# Patient Record
Sex: Male | Born: 1980 | Race: White | Hispanic: No | Marital: Single | Smoking: Never smoker
Health system: Southern US, Community
[De-identification: ages and names within clinical notes are randomized; demographics above are authoritative.]

## PROBLEM LIST (undated history)

## (undated) ENCOUNTER — Emergency Department (HOSPITAL_COMMUNITY): Admission: EM | Payer: Self-pay | Source: Home / Self Care

---

## 2007-02-28 ENCOUNTER — Emergency Department (HOSPITAL_COMMUNITY): Admission: EM | Admit: 2007-02-28 | Discharge: 2007-02-28 | Payer: Self-pay | Admitting: Emergency Medicine

## 2007-03-05 ENCOUNTER — Ambulatory Visit: Payer: Self-pay | Admitting: Orthopedic Surgery

## 2007-03-05 DIAGNOSIS — M25529 Pain in unspecified elbow: Secondary | ICD-10-CM | POA: Insufficient documentation

## 2010-03-25 ENCOUNTER — Emergency Department (HOSPITAL_COMMUNITY): Admission: EM | Admit: 2010-03-25 | Discharge: 2010-03-25 | Payer: Self-pay | Admitting: Emergency Medicine

## 2012-07-03 ENCOUNTER — Emergency Department (HOSPITAL_COMMUNITY)
Admission: EM | Admit: 2012-07-03 | Discharge: 2012-07-04 | Disposition: A | Discharge status: Home / Self Care | Payer: Self-pay | Attending: Emergency Medicine | Admitting: Emergency Medicine

## 2012-07-03 ENCOUNTER — Encounter (HOSPITAL_COMMUNITY): Payer: Self-pay | Admitting: *Deleted

## 2012-07-03 DIAGNOSIS — N5089 Other specified disorders of the male genital organs: Secondary | ICD-10-CM | POA: Insufficient documentation

## 2012-07-03 DIAGNOSIS — N453 Epididymo-orchitis: Secondary | ICD-10-CM | POA: Insufficient documentation

## 2012-07-03 DIAGNOSIS — R109 Unspecified abdominal pain: Secondary | ICD-10-CM | POA: Insufficient documentation

## 2012-07-03 LAB — URINALYSIS, ROUTINE W REFLEX MICROSCOPIC
Bilirubin Urine: NEGATIVE
Hgb urine dipstick: NEGATIVE
Ketones, ur: NEGATIVE mg/dL
Leukocytes, UA: NEGATIVE
Protein, ur: NEGATIVE mg/dL
Specific Gravity, Urine: >1.03 — ABNORMAL HIGH (ref 1.005–1.030)

## 2012-07-03 MED ORDER — IBUPROFEN 800 MG PO TABS
800.0000 mg | ORAL_TABLET | Freq: Once | ORAL | Status: AC
  Administered 2012-07-03: 800 mg via ORAL
  Filled 2012-07-03: qty 1

## 2012-07-03 MED ORDER — HYDROCODONE-ACETAMINOPHEN 5-325 MG PO TABS
2.0000 | ORAL_TABLET | Freq: Once | ORAL | Status: AC
  Administered 2012-07-03: 2 via ORAL
  Filled 2012-07-03: qty 2

## 2012-07-03 NOTE — ED Notes (Signed)
Lt testicular swelling and  Pain, No injury.

## 2012-07-03 NOTE — ED Provider Notes (Signed)
History    This chart was scribed for Edward Skene, MD by Gerlean Ren, ED Scribe. This patient was seen in room [facility]11/[facility]11 and the patient's care was started at 11:05 PM    CSN: [id number]  Arrival date & time 07/03/12  2202   First MD Initiated Contact with Patient 07/03/12 2259      Chief Complaint  Patient presents with  . Testicle Pain     The history is provided by the patient. No language interpreter was used.  Edward Farrell is a 32 y.o. male who presents to the Emergency Department complaining of 2 days of gradual onset, constant left testicular pain that has been gradually worsening in severity with associated swelling beginning last night and gradually worsening since onset. Pain is 9/10 when contact is made with it, but significantly more mild and dull without touch.  Pt denies penile discharge, penile pain, dysuria, urgency, frequency, fever, chills, nausea, emesis, blood in stools, tarry stools, diarrhea.  Pt reports some lower abdominal pain.  No associated traumas to testicles.  Nothing used for pain.  No known allergies.  No family h/o cancer.  Pt reports social alcohol use and rare marijuana use.  History reviewed. No pertinent past medical history.  History reviewed. No pertinent past surgical history.  History reviewed. No pertinent family history.  History  Substance Use Topics  . Smoking status: Never Smoker   . Smokeless tobacco: Not on file  . Alcohol Use: Yes     Comment: rarely      Review of Systems  Constitutional: Negative for fever.  Eyes: Negative for visual disturbance.  Respiratory: Negative for shortness of breath.   Cardiovascular: Negative for chest pain.  Gastrointestinal: Positive for abdominal pain. Negative for nausea, vomiting and diarrhea.  Endocrine: Negative for polyuria.  Genitourinary: Positive for testicular pain. Negative for dysuria, urgency, frequency, hematuria, discharge and penile pain.  Musculoskeletal: Negative for  myalgias.  Skin: Negative for rash.  Neurological: Negative for headaches.  All other systems reviewed and are negative.    Allergies  Review of patient's allergies indicates not on file.  Home Medications  No current outpatient prescriptions on file.  BP 120/74  Pulse 76  Temp(Src) 98.6 F (37 C) (Oral)  Resp 18  Ht 6\' 2"  (1.88 m)  Wt 180 lb (81.647 kg)  BMI 23.1 kg/m2  SpO2 98%  Physical Exam  Nursing notes reviewed.  Electronic medical record reviewed. VITAL SIGNS:   Filed Vitals:   07/03/12 2207 07/04/12 0056  BP: 120/74 123/64  Pulse: 76 65  Temp: 98.6 F (37 C)   TempSrc: Oral   Resp: 18 20  Height: 6\' 2"  (1.88 m)   Weight: 180 lb (81.647 kg)   SpO2: 98% 97%   CONSTITUTIONAL: Awake, oriented, appears non-toxic HENT: Atraumatic, normocephalic, oral mucosa pink and moist, airway patent. Nares patent without drainage. External ears normal. EYES: Conjunctiva clear, EOMI, PERRLA NECK: Trachea midline, non-tender, supple CARDIOVASCULAR: Normal heart rate, Normal rhythm, No murmurs, rubs, gallops PULMONARY/CHEST: Clear to auscultation, no rhonchi, wheezes, or rales. Symmetrical breath sounds. Non-tender. ABDOMINAL: Non-distended, soft, non-tender - no rebound or guarding.  BS normal. NEUROLOGIC: Non-focal, moving all four extremities, no gross sensory or motor deficits. GU: Normal circumcised male. No hernias appreciated. No ulcerations or rash. Left testicle is tender to palpation especially around the epididymis which is firm and swollen. No lymphadenopathy. EXTREMITIES: No clubbing, cyanosis, or edema SKIN: Warm, Dry, No erythema, No rash  ED Course  Procedures (including  critical care time) DIAGNOSTIC STUDIES: Oxygen Saturation is 98% on room air, normal by my interpretation.    COORDINATION OF CARE: 11:14 PM- Patient informed of clinical course including urinalysis, understands medical decision-making process, and agrees with plan.  Results for orders  placed during the hospital encounter of 07/03/12  URINALYSIS, ROUTINE W REFLEX MICROSCOPIC      Result Value Range   Color, Urine YELLOW  YELLOW   APPearance HAZY (*) CLEAR   Specific Gravity, Urine >1.030 (*) 1.005 - 1.030   pH 6.0  5.0 - 8.0   Glucose, UA NEGATIVE  NEGATIVE mg/dL   Hgb urine dipstick NEGATIVE  NEGATIVE   Bilirubin Urine NEGATIVE  NEGATIVE   Ketones, ur NEGATIVE  NEGATIVE mg/dL   Protein, ur NEGATIVE  NEGATIVE mg/dL   Urobilinogen, UA 0.2  0.0 - 1.0 mg/dL   Nitrite NEGATIVE  NEGATIVE   Leukocytes, UA NEGATIVE  NEGATIVE    US Scrotum  07/04/2012  *RADIOLOGY REPORT*  Clinical Data:  Left testicular pain.  Pain for 3 days.  SCROTAL ULTRASOUND DOPPLER ULTRASOUND OF THE TESTICLES  Technique: Complete ultrasound examination of the testicles, epididymis, and other scrotal structures was performed.  Color and spectral Doppler ultrasound were also utilized to evaluate blood flow to the testicles.  Comparison:  None.  Findings:  Right testis:  The right testis measures 4.2 x 2.5 x 2.2 cm.  No focal testicular mass lesions.  Homogeneous parenchymal appearance. Normal flow demonstrated in the testis on color flow Doppler imaging.  Left testis:  The left testis measures 3.6 x 2.3 x 2.6 cm.  No focal testicular mass lesions.  Homogeneous parenchymal appearance. Normal flow demonstrated in the testis on color flow Doppler imaging.  Right epididymis:  Normal in size and appearance.  Left epididymis:  Increased flow is demonstrated to the left epididymis suggesting epididymitis.  No focal abscess.  Hydrocele:  Small bilateral hydroceles.  Varicocele:  No varicoceles present  Pulsed Doppler interrogation of both testes demonstrates low resistance flow bilaterally.  IMPRESSION: Normal appearance of the testicles.  Increased flow to the left epididymis suggest epididymitis.  Small bilateral hydroceles.   Original Report Authenticated By: Burman Nieves, M.D.    [place] Art/ven Flow Abd Pelv  Doppler  07/04/2012  *RADIOLOGY REPORT*  Clinical Data:  Left testicular pain.  Pain for 3 days.  SCROTAL ULTRASOUND DOPPLER ULTRASOUND OF THE TESTICLES  Technique: Complete ultrasound examination of the testicles, epididymis, and other scrotal structures was performed.  Color and spectral Doppler ultrasound were also utilized to evaluate blood flow to the testicles.  Comparison:  None.  Findings:  Right testis:  The right testis measures 4.2 x 2.5 x 2.2 cm.  No focal testicular mass lesions.  Homogeneous parenchymal appearance. Normal flow demonstrated in the testis on color flow Doppler imaging.  Left testis:  The left testis measures 3.6 x 2.3 x 2.6 cm.  No focal testicular mass lesions.  Homogeneous parenchymal appearance. Normal flow demonstrated in the testis on color flow Doppler imaging.  Right epididymis:  Normal in size and appearance.  Left epididymis:  Increased flow is demonstrated to the left epididymis suggesting epididymitis.  No focal abscess.  Hydrocele:  Small bilateral hydroceles.  Varicocele:  No varicoceles present  Pulsed Doppler interrogation of both testes demonstrates low resistance flow bilaterally.  IMPRESSION: Normal appearance of the testicles.  Increased flow to the left epididymis suggest epididymitis.  Small bilateral hydroceles.   Original Report Authenticated By: Burman Nieves, M.D.  1. Epididymitis, left       MDM  AQIB LOUGH is a 32 y.o. male presenting with left testicular pain. He denies any antecedent trauma, no history of torsion, ultrasound shows enlargement of left epididymis and bilateral small hydroceles. No evidence of torsion. This is consistent with physical exam suggesting epididymitis-discussed possible STI with the patient in that he may need to have a conversation with his girlfriend concerning this-he denies any sexual relationships outside of his monogamous relationship with his girlfriend - patient is had no other symptoms of STI such as  penile discharge or pain prior to today.  We'll discharge the patient home stable and in good condition with pain control as well as doxycycline. Discussed reasons to return to the emergency department including worsening pain sudden onset of severe pain, high fevers, vomiting or any other concerning symptoms. Have answered all the patient's questions.  I personally performed the services described in this documentation, which was scribed in my presence. The recorded information has been reviewed and is accurate. Edward Farrell, M.D.          Edward Skene, MD 07/04/12 [phone]

## 2012-07-04 ENCOUNTER — Emergency Department (HOSPITAL_COMMUNITY): Payer: Self-pay

## 2012-07-04 ENCOUNTER — Other Ambulatory Visit (HOSPITAL_COMMUNITY): Payer: Self-pay

## 2012-07-04 MED ORDER — HYDROCODONE-ACETAMINOPHEN 5-325 MG PO TABS: 1.0000 | ORAL_TABLET | Freq: Four times a day (QID) | ORAL | Status: DC | PRN

## 2012-07-04 MED ORDER — IBUPROFEN 800 MG PO TABS: 800.0000 mg | ORAL_TABLET | Freq: Three times a day (TID) | ORAL | Status: DC

## 2012-07-04 MED ORDER — DOXYCYCLINE HYCLATE 100 MG PO CAPS: 100.0000 mg | ORAL_CAPSULE | Freq: Two times a day (BID) | ORAL | Status: DC

## 2014-10-24 ENCOUNTER — Encounter (HOSPITAL_COMMUNITY): Payer: Self-pay | Admitting: Emergency Medicine

## 2014-10-24 ENCOUNTER — Emergency Department (HOSPITAL_COMMUNITY): Payer: Self-pay

## 2014-10-24 ENCOUNTER — Emergency Department (HOSPITAL_COMMUNITY)
Admission: EM | Admit: 2014-10-24 | Discharge: 2014-10-24 | Disposition: A | Discharge status: Home / Self Care | Payer: Self-pay | Attending: Emergency Medicine | Admitting: Emergency Medicine

## 2014-10-24 DIAGNOSIS — R112 Nausea with vomiting, unspecified: Secondary | ICD-10-CM

## 2014-10-24 DIAGNOSIS — R109 Unspecified abdominal pain: Secondary | ICD-10-CM | POA: Insufficient documentation

## 2014-10-24 DIAGNOSIS — Z792 Long term (current) use of antibiotics: Secondary | ICD-10-CM | POA: Insufficient documentation

## 2014-10-24 DIAGNOSIS — N179 Acute kidney failure, unspecified: Secondary | ICD-10-CM | POA: Insufficient documentation

## 2014-10-24 DIAGNOSIS — Z791 Long term (current) use of non-steroidal anti-inflammatories (NSAID): Secondary | ICD-10-CM | POA: Insufficient documentation

## 2014-10-24 LAB — URINALYSIS, ROUTINE W REFLEX MICROSCOPIC
BILIRUBIN URINE: NEGATIVE
GLUCOSE, UA: NEGATIVE mg/dL
KETONES UR: NEGATIVE mg/dL
Leukocytes, UA: NEGATIVE
Nitrite: NEGATIVE
PROTEIN: NEGATIVE mg/dL
Specific Gravity, Urine: 1.025 (ref 1.005–1.030)
UROBILINOGEN UA: 0.2 mg/dL (ref 0.0–1.0)
pH: 5.5 (ref 5.0–8.0)

## 2014-10-24 LAB — COMPREHENSIVE METABOLIC PANEL
ALT: 14 U/L — AB (ref 17–63)
AST: 17 U/L (ref 15–41)
Albumin: 4.1 g/dL (ref 3.5–5.0)
Alkaline Phosphatase: 63 U/L (ref 38–126)
Anion gap: 9 (ref 5–15)
BILIRUBIN TOTAL: 0.8 mg/dL (ref 0.3–1.2)
BUN: 21 mg/dL — ABNORMAL HIGH (ref 6–20)
CHLORIDE: 103 mmol/L (ref 101–111)
CO2: 27 mmol/L (ref 22–32)
Calcium: 9.1 mg/dL (ref 8.9–10.3)
Creatinine, Ser: 1.98 mg/dL — ABNORMAL HIGH (ref 0.61–1.24)
GFR, EST AFRICAN AMERICAN: 49 mL/min — AB (ref >60)
GFR, EST NON AFRICAN AMERICAN: 42 mL/min — AB (ref >60)
Glucose, Bld: 123 mg/dL — ABNORMAL HIGH (ref 65–99)
Potassium: 3.5 mmol/L (ref 3.5–5.1)
SODIUM: 139 mmol/L (ref 135–145)
Total Protein: 6.8 g/dL (ref 6.5–8.1)

## 2014-10-24 LAB — URINE MICROSCOPIC-ADD ON

## 2014-10-24 LAB — CBC WITH DIFFERENTIAL/PLATELET
BASOS ABS: 0 10*3/uL (ref 0.0–0.1)
BASOS PCT: 0 % (ref 0–1)
Eosinophils Absolute: 0.1 10*3/uL (ref 0.0–0.7)
Eosinophils Relative: 1 % (ref 0–5)
HEMATOCRIT: 43.4 % (ref 39.0–52.0)
Hemoglobin: 14.7 g/dL (ref 13.0–17.0)
LYMPHS PCT: 24 % (ref 12–46)
Lymphs Abs: 2.5 10*3/uL (ref 0.7–4.0)
MCH: 30.6 pg (ref 26.0–34.0)
MCHC: 33.9 g/dL (ref 30.0–36.0)
MCV: 90.2 fL (ref 78.0–100.0)
MONO ABS: 1.1 10*3/uL — AB (ref 0.1–1.0)
Monocytes Relative: 10 % (ref 3–12)
NEUTROS ABS: 7 10*3/uL (ref 1.7–7.7)
Neutrophils Relative %: 65 % (ref 43–77)
PLATELETS: 226 10*3/uL (ref 150–400)
RBC: 4.81 MIL/uL (ref 4.22–5.81)
RDW: 12.8 % (ref 11.5–15.5)
WBC: 10.6 10*3/uL — AB (ref 4.0–10.5)

## 2014-10-24 MED ORDER — ONDANSETRON HCL 4 MG/2ML IJ SOLN
4.0000 mg | Freq: Once | INTRAMUSCULAR | Status: AC
  Administered 2014-10-24: 4 mg via INTRAVENOUS
  Filled 2014-10-24: qty 2

## 2014-10-24 MED ORDER — KETOROLAC TROMETHAMINE 30 MG/ML IJ SOLN
30.0000 mg | Freq: Once | INTRAMUSCULAR | Status: AC
  Administered 2014-10-24: 30 mg via INTRAVENOUS
  Filled 2014-10-24: qty 1

## 2014-10-24 MED ORDER — PROMETHAZINE HCL 25 MG PO TABS: 25.0000 mg | ORAL_TABLET | Freq: Four times a day (QID) | ORAL | Status: DC | PRN

## 2014-10-24 MED ORDER — SODIUM CHLORIDE 0.9 % IV SOLN
INTRAVENOUS | Status: DC
Start: 2014-10-24 — End: 2014-10-24
  Administered 2014-10-24: 17:00:00 via INTRAVENOUS

## 2014-10-24 MED ORDER — TRAMADOL HCL 50 MG PO TABS: 50.0000 mg | ORAL_TABLET | Freq: Four times a day (QID) | ORAL | Status: DC | PRN

## 2014-10-24 MED ORDER — SODIUM CHLORIDE 0.9 % IV BOLUS (SEPSIS): 1000.0000 mL | Freq: Once | INTRAVENOUS | Status: DC

## 2014-10-24 NOTE — ED Notes (Signed)
2 day history of pain that began in mid lower spine, now located at R mid abdomen.  No rebound tenderness, no tenderness to abdominal palpation.  Denies blood in stool or urine.  Had chills yesterday, but no known fever or nausea.

## 2014-10-24 NOTE — ED Notes (Signed)
Patient with no complaints at this time. Respirations even and unlabored. Skin warm/dry. Discharge instructions reviewed with patient at this time. Patient given opportunity to voice concerns/ask questions. IV removed per policy and band-aid applied to site. Patient discharged at this time and left Emergency Department with steady gait.  

## 2014-10-24 NOTE — ED Notes (Signed)
Patient complaining of right flank pain since last night. Also complaining of vomiting.

## 2014-10-24 NOTE — ED Provider Notes (Signed)
TIME SEEN: 4:35 PM  CHIEF COMPLAINT: Right flank pain, vomiting  HPI: Pt is a 34 y.o. male with no significant past medical history who presents to the emergency department with right flank pain that started 2 days ago and vomiting that started last night. No aggravating or relieving factors. Pain is severe at times but currently is mild. Denies diarrhea. States pain does radiate into his abdomen. Has never had kidney stones before but thinks that is what is going on today. No dysuria, hematuria, penile discharge, testicular pain or swelling. No prior history of abdominal surgeries. Denies injury to the back. States he had chills yesterday but no fever.  ROS: See HPI Constitutional: no fever  Eyes: no drainage  ENT: no runny nose   Cardiovascular:  no chest pain  Resp: no SOB  GI: no vomiting GU: no dysuria Integumentary: no rash  Allergy: no hives  Musculoskeletal: no leg swelling  Neurological: no slurred speech ROS otherwise negative  PAST MEDICAL HISTORY/PAST SURGICAL HISTORY:  History reviewed. No pertinent past medical history.  MEDICATIONS:  Prior to Admission medications   Medication Sig Start Date End Date Taking? Authorizing Provider  doxycycline (VIBRAMYCIN) 100 MG capsule Take 1 capsule (100 mg total) by mouth 2 (two) times daily. 07/04/12   John-Adam Bonk, MD  HYDROcodone-acetaminophen (NORCO/VICODIN) 5-325 MG per tablet Take 1-2 tablets by mouth every 6 (six) hours as needed for pain. 07/04/12   John-Adam Bonk, MD  ibuprofen (ADVIL,MOTRIN) 800 MG tablet Take 1 tablet (800 mg total) by mouth 3 (three) times daily. 07/04/12   Jones Skene, MD    ALLERGIES:  No Known Allergies  SOCIAL HISTORY:  History  Substance Use Topics  . Smoking status: Never Smoker   . Smokeless tobacco: Not on file  . Alcohol Use: No    FAMILY HISTORY: History reviewed. No pertinent family history.  EXAM: BP 126/67 mmHg  Pulse 87  Temp(Src) 99.6 F (37.6 C) (Oral)  Resp 18  Ht 6'  2" (1.88 m)  Wt 190 lb (86.183 kg)  BMI 24.38 kg/m2  SpO2 98% CONSTITUTIONAL: Alert and oriented and responds appropriately to questions. Well-appearing; well-nourished, appears comfortable currently and nontoxic, afebrile HEAD: Normocephalic EYES: Conjunctivae clear, PERRL ENT: normal nose; no rhinorrhea; moist mucous membranes; pharynx without lesions noted NECK: Supple, no meningismus, no LAD  CARD: RRR; S1 and S2 appreciated; no murmurs, no clicks, no rubs, no gallops RESP: Normal chest excursion without splinting or tachypnea; breath sounds clear and equal bilaterally; no wheezes, no rhonchi, no rales, no hypoxia or respiratory distress, speaking full sentences ABD/GI: Normal bowel sounds; non-distended; soft, non-tender, no rebound, no guarding, no peritoneal signs BACK:  The back appears normal and is non-tender to palpation, there is no CVA tenderness, no midline spinal tenderness or step-off or deformity EXT: Normal ROM in all joints; non-tender to palpation; no edema; normal capillary refill; no cyanosis, no calf tenderness or swelling    SKIN: Normal color for age and race; warm NEURO: Moves all extremities equally, sensation to light touch intact diffusely, cranial nerves II through XII intact PSYCH: The patient's mood and manner are appropriate. Grooming and personal hygiene are appropriate.  MEDICAL DECISION MAKING: Patient here with right flank pain and vomiting. No injury to the back. Neurologically intact. Afebrile. Appears comfortable currently. Suspect kidney stone. We'll check labs, urine and CT scan. We'll give Toradol and Zofran for pain and nausea.  ED PROGRESS: Patient's labs show creatinine of 1.98. No old for comparison. He has received  a liter of IV fluids in the ED. He reports he does use Goody powders regularly. Have advised to avoid all NSAIDs and have his creatinine rechecked in the next 1-2 weeks. Have encouraged increased fluid intake at home. CT scan shows no  hydronephrosis, renal or ureterolithiasis or other acute abnormality. Patient reports his pain has improved significantly. He may have had a stone that he has early past. We'll discharge with tramadol, Zofran to use as needed. Discussed return precautions. He verbalized understanding and is comfortable with plan.     Layla Maw Ward, DO 10/24/14 [facility]

## 2014-10-24 NOTE — Discharge Instructions (Signed)
Please increase your water intake over the next several weeks. Please avoid ibuprofen, Motrin, aspirin, Aleve, naproxen, Goody powders as these may be affecting her kidney function. He will need to have your creatinine rechecked in 1-2 weeks by primary care provider. Your creatinine today was 1.98, BUN 21. Your CT scan showed no kidney stone but you may have had a stone that she is ready past. No sign of urinary tract infection. I feel you're safe to go home. If you have worsening pain, vomiting that will not stop, fever, please return to the hospital.   Acute Kidney Injury Acute kidney injury is a disease in which there is sudden (acute) damage to the kidneys. The kidneys are 2 organs that lie on either side of the spine between the middle of the back and the front of the abdomen. The kidneys:  Remove wastes and extra water from the blood.   Produce important hormones. These help keep bones strong, regulate blood pressure, and help create red blood cells.   Balance the fluids and chemicals in the blood and tissues. A small amount of kidney damage may not cause problems, but a large amount of damage may make it difficult or impossible for the kidneys to work the way they should. Acute kidney injury may develop into long-lasting (chronic) kidney disease. It may also develop into a life-threatening disease called end-stage kidney disease. Acute kidney injury can get worse very quickly, so it should be treated right away. Early treatment may prevent other kidney diseases from developing.  CAUSES   A problem with blood flow to the kidneys. This may be caused by:   Blood loss.   Heart disease.   Severe burns.   Liver disease.  Direct damage to the kidneys. This may be caused by:  Some medicines.   A kidney infection.   Poisoning or consuming toxic substances.   A surgical wound.   A blow to the kidney area.   A problem with urine flow. This may be caused by:   Cancer.    Kidney stones.   An enlarged prostate. SYMPTOMS   Swelling (edema) of the legs, ankles, or feet.   Tiredness (lethargy).   Nausea or vomiting.   Confusion.   Problems with urination, such as:   Painful or burning feeling during urination.   Decreased urine production.   Frequent accidents in children who are potty trained.   Bloody urine.   Muscle twitches and cramps.   Shortness of breath.   Seizures.   Chest pain or pressure. Sometimes, no symptoms are present. DIAGNOSIS Acute kidney injury may be detected and diagnosed by tests, including blood, urine, imaging, or kidney biopsy tests.  TREATMENT Treatment of acute kidney injury varies depending on the cause and severity of the kidney damage. In mild cases, no treatment may be needed. The kidneys may heal on their own. If acute kidney injury is more severe, your caregiver will treat the cause of the kidney damage, help the kidneys heal, and prevent complications from occurring. Severe cases may require a procedure to remove toxic wastes from the body (dialysis) or surgery to repair kidney damage. Surgery may involve:   Repair of a torn kidney.   Removal of an obstruction. Most of the time, you will need to stay overnight at the hospital.  HOME CARE INSTRUCTIONS:  Follow your prescribed diet.  Only take over-the-counter or prescription medicines as directed by your caregiver.  Do not take any new medicines (prescription, over-the-counter, or nutritional  supplements) unless approved by your caregiver. Many medicines can worsen your kidney damage or need to have the dose adjusted.   Keep all follow-up appointments as directed by your caregiver.  Observe your condition to make sure you are healing as expected. SEEK IMMEDIATE MEDICAL CARE IF:  You are feeling ill or have severe pain in the back or side.   Your symptoms return or you have new symptoms.  You have any symptoms of end-stage  kidney disease. These include:   Persistent itchiness.   Loss of appetite.   Headaches.   Abnormally dark or light skin.  Numbness in the hands or feet.   Easy bruising.   Frequent hiccups.   Menstruation stops.   You have a fever.  You have increased urine production.  You have pain or bleeding when urinating. MAKE SURE YOU:   Understand these instructions.  Will watch your condition.  Will get help right away if you are not doing well or get worse Document Released: 11/12/2010 Document Revised: 08/24/2012 Document Reviewed: 12/27/2011 [facility] Patient Information 2015 [place], [place]. This information is not intended to replace advice given to you by your health care provider. Make sure you discuss any questions you have with your health care provider.  Flank Pain Flank pain refers to pain that is located on the side of the body between the upper abdomen and the back. The pain may occur over a short period of time (acute) or may be long-term or reoccurring (chronic). It may be mild or severe. Flank pain can be caused by many things. CAUSES  Some of the more common causes of flank pain include:  Muscle strains.   Muscle spasms.   A disease of your spine (vertebral disk disease).   A lung infection (pneumonia).   Fluid around your lungs (pulmonary edema).   A kidney infection.   Kidney stones.   A very painful skin rash caused by the chickenpox virus (shingles).   Gallbladder disease.  HOME CARE INSTRUCTIONS  Home care will depend on the cause of your pain. In general,  Rest as directed by your caregiver.  Drink enough fluids to keep your urine clear or pale yellow.  Only take over-the-counter or prescription medicines as directed by your caregiver. Some medicines may help relieve the pain.  Tell your caregiver about any changes in your pain.  Follow up with your caregiver as directed. SEEK IMMEDIATE MEDICAL CARE IF:   Your pain is  not controlled with medicine.   You have new or worsening symptoms.  Your pain increases.   You have abdominal pain.   You have shortness of breath.   You have persistent nausea or vomiting.   You have swelling in your abdomen.   You feel faint or pass out.   You have blood in your urine.  You have a fever or persistent symptoms for more than 2-3 days.  You have a fever and your symptoms suddenly get worse. MAKE SURE YOU:   Understand these instructions.  Will watch your condition.  Will get help right away if you are not doing well or get worse. Document Released: 06/20/2005 Document Revised: 01/22/2012 Document Reviewed: 12/12/2011 [facility] Patient Information 2015 [place], [place]. This information is not intended to replace advice given to you by your health care provider. Make sure you discuss any questions you have with your health care provider.

## 2015-01-02 ENCOUNTER — Emergency Department (HOSPITAL_COMMUNITY): Payer: Self-pay

## 2015-01-02 ENCOUNTER — Encounter (HOSPITAL_COMMUNITY): Payer: Self-pay | Admitting: *Deleted

## 2015-01-02 ENCOUNTER — Emergency Department (HOSPITAL_COMMUNITY)
Admission: EM | Admit: 2015-01-02 | Discharge: 2015-01-02 | Disposition: A | Discharge status: Home / Self Care | Payer: Self-pay | Attending: Emergency Medicine | Admitting: Emergency Medicine

## 2015-01-02 DIAGNOSIS — Y9389 Activity, other specified: Secondary | ICD-10-CM | POA: Insufficient documentation

## 2015-01-02 DIAGNOSIS — M7041 Prepatellar bursitis, right knee: Secondary | ICD-10-CM | POA: Insufficient documentation

## 2015-01-02 MED ORDER — PREDNISONE 10 MG PO TABS: ORAL_TABLET | ORAL | Status: DC

## 2015-01-02 MED ORDER — TRAMADOL HCL 50 MG PO TABS: 50.0000 mg | ORAL_TABLET | Freq: Four times a day (QID) | ORAL | Status: DC | PRN

## 2015-01-02 NOTE — ED Notes (Signed)
Pt c/o right knee pain; pt states he fell on concrete x 1 month ago and is still having some pain and swelling

## 2015-01-02 NOTE — Discharge Instructions (Signed)
Prepatellar Bursitis with Rehab  Bursitis is a condition that is characterized by inflammation of a bursa. Kateri McBursa exists in many areas of the body. They are fluid-filled sacs that lie between a soft tissue (skin, tendon, or ligament) and a bone, and they reduce friction between the structures as well as the stress placed on the soft tissue. Prepatellar bursitis is inflammation of the bursa that lies between the skin and the kneecap (patella). This condition often causes pain over the patella. SYMPTOMS   Pain, tenderness, and/or inflammation over the patella.  Pain that worsens with movement of the knee joint.  Decreased range of motion for the knee joint.  A crackling sound (crepitation) when the bursa is moved or touched.  Occasionally, painless swelling of the bursa.  Fever (when infected). CAUSES  Bursitis is caused by damage to the bursa, which results in an inflammatory response. Common mechanisms of injury include:  Direct trauma to the front of the knee.  Repetitive and/or stressful use of the knee. RISK INCREASES WITH:  Activities in which kneeling and/or falling on one's knees is likely (volleyball or football).  Repetitive and stressful training, especially if it involves running on hills.  Improper training techniques, such as a sudden increase in the intensity, frequency, or duration of training.  Failure to warm up properly before activity.  Poor technique.  Artificial turf. PREVENTION   Avoid kneeling or falling on your knees.  Warm up and stretch properly before activity.  Allow for adequate recovery between workouts.  Maintain physical fitness:  Strength, flexibility, and endurance.  Cardiovascular fitness.  Learn and use proper technique. When possible, have a coach correct improper technique.  Wear properly fitted and padded protective equipment (kneepads). PROGNOSIS  If treated properly, then the symptoms of prepatellar bursitis usually resolve  within 2 weeks. RELATED COMPLICATIONS   Recurrent symptoms that result in a chronic problem.  Prolonged healing time, if improperly treated or reinjured.  Limited range of motion.  Infection of bursa.  Chronic inflammation or scarring of bursa. TREATMENT  Treatment initially involves the use of ice and medication to help reduce pain and inflammation. The use of strengthening and stretching exercises may help reduce pain with activity, especially those of the quadriceps and hamstring muscles. These exercises may be performed at home or with referral to a therapist. Your caregiver may recommend kneepads when you return to playing sports, in order to reduce the stress on the prepatellar bursa. If symptoms persist despite treatment, then your caregiver may drain fluid out with a needle (aspirate) the bursa. If symptoms persist for greater than 6 months despite nonsurgical (conservative) treatment, then surgery may be recommended to remove the bursa.  MEDICATION  If pain medication is necessary, then nonsteroidal anti-inflammatory medications, such as aspirin and ibuprofen, or other minor pain relievers, such as acetaminophen, are often recommended.  Do not take pain medication for 7 days before surgery.  Prescription pain relievers may be given if deemed necessary by your caregiver. Use only as directed and only as much as you need.  Corticosteroid injections may be given by your caregiver. These injections should be reserved for the most serious cases, because they may only be given a certain number of times. HEAT AND COLD  Cold treatment (icing) relieves pain and reduces inflammation. Cold treatment should be applied for 10 to 15 minutes every 2 to 3 hours for inflammation and pain and immediately after any activity that aggravates your symptoms. Use ice packs or massage the area with  a piece of ice (ice massage). °· Heat treatment may be used prior to performing the stretching and  strengthening activities prescribed by your caregiver, physical therapist, or athletic trainer. Use a heat pack or soak the injury in warm water. °SEEK MEDICAL CARE IF: °· Treatment seems to offer no benefit, or the condition worsens. °· Any medications produce adverse side effects. ° °

## 2015-01-04 NOTE — ED Provider Notes (Signed)
CSN: [id number]     Arrival date & time 01/02/15  1908 History   First MD Initiated Contact with Patient 01/02/15 2001     Chief Complaint  Patient presents with  . Knee Pain     (Consider location/radiation/quality/duration/timing/severity/associated sxs/prior Treatment) The history is provided by the patient.   Edward Farrell is a 34 y.o. male presenting with right knee pain and swelling.  He reports falling onto cement approximately 1 month ago while working, landing on both knees, but the right took the majority of the impact.  He had pain and swelling which improved, but over the past week has had na recurrence of mild pain, but significant swelling.  He denies any new injury but is active with his job in [employer].  There is no radiation of pain which he reports as mild and worsened with full range of motion.  He has had no medicines or treatment prior to arrival. He denies abrasion or skin wound at the time of the injury.    History reviewed. No pertinent past medical history. History reviewed. No pertinent past surgical history. History reviewed. No pertinent family history. Social History  Substance Use Topics  . Smoking status: Never Smoker   . Smokeless tobacco: None  . Alcohol Use: No    Review of Systems  Constitutional: Negative for fever.  Musculoskeletal: Positive for joint swelling and arthralgias. Negative for myalgias.  Neurological: Negative for weakness and numbness.      Allergies  Review of patient's allergies indicates no known allergies.  Home Medications   Prior to Admission medications   Medication Sig Start Date End Date Taking? Authorizing Provider  predniSONE (DELTASONE) 10 MG tablet 6, 5, 4, 3, 2 then 1 tablet by mouth daily for 6 days total. 01/02/15   Burgess Amor, PA-C  promethazine (PHENERGAN) 25 MG tablet Take 1 tablet (25 mg total) by mouth every 6 (six) hours as needed for nausea or vomiting. Patient not taking: Reported on  01/02/2015 10/24/14   Layla Maw Ward, DO  traMADol (ULTRAM) 50 MG tablet Take 1 tablet (50 mg total) by mouth every 6 (six) hours as needed for moderate pain. 01/02/15   Burgess Amor, PA-C   BP 129/74 mmHg  Pulse 73  Temp(Src) 98.3 F (36.8 C) (Oral)  Resp 20  Ht  (1.88 m)  Wt 190 lb (86.183 kg)  BMI 24.38 kg/m2  SpO2 97% Physical Exam  Constitutional: He appears well-developed and well-nourished.  HENT:  Head: Atraumatic.  Neck: Normal range of motion.  Cardiovascular:  Pulses equal bilaterally  Musculoskeletal: He exhibits tenderness.       Right knee: He exhibits swelling. He exhibits no effusion, no erythema, normal alignment, no LCL laxity, normal patellar mobility and no MCL laxity.  Mild ttp right patella with moderate prepatellar soft swelling.  No erythema.  Pt displays FROM with minimal discomfort. No abrasions, skin appears atraumatic.  Neurological: He is alert. He has normal strength. He displays normal reflexes. No sensory deficit.  Skin: Skin is warm and dry.  Psychiatric: He has a normal mood and affect.    ED Course  Procedures (including critical care time) Labs Review Labs Reviewed - No data to display  Imaging Review Dg Knee Complete 4 Views Right  01/02/2015   CLINICAL DATA:  Larey Seat on concrete 1 month ago, persistent RIGHT knee pain and swelling  EXAM: RIGHT KNEE - COMPLETE 4+ VIEW  COMPARISON:  None  FINDINGS: Bone mineralization normal.  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  Joint spaces preserved.  No fracture, dislocation, or bone destruction.  Prepatellar soft tissue swelling.  No joint effusion.  IMPRESSION: Prepatellar soft tissue swelling.  No acute osseous abnormality.   Electronically Signed   By: Ulyses Southward M.D.   On: 01/02/2015 21:30   I have personally reviewed and evaluated these images and lab results as part of my medical decision-making.   EKG Interpretation None      MDM   Final diagnoses:  Prepatellar bursitis, right     Radiological studies were viewed, interpreted and considered during the medical decision making and disposition process. I agree with radiologists reading.  Results were also discussed with patient.   Pt placed in ace wrap, prednisone, tramadol prescribed, advised ice tx.  Referral to orthopedics for further eval/tx.      Burgess Amor, PA-C 01/04/15 1032  Raeford Razor, MD 01/04/15 [phone]

## 2015-01-13 ENCOUNTER — Emergency Department (HOSPITAL_COMMUNITY)
Admission: EM | Admit: 2015-01-13 | Discharge: 2015-01-14 | Disposition: A | Discharge status: Home / Self Care | Payer: Self-pay | Attending: Emergency Medicine | Admitting: Emergency Medicine

## 2015-01-13 ENCOUNTER — Encounter (HOSPITAL_COMMUNITY): Payer: Self-pay | Admitting: Emergency Medicine

## 2015-01-13 DIAGNOSIS — M7041 Prepatellar bursitis, right knee: Secondary | ICD-10-CM | POA: Insufficient documentation

## 2015-01-13 DIAGNOSIS — Y9389 Activity, other specified: Secondary | ICD-10-CM | POA: Insufficient documentation

## 2015-01-13 NOTE — ED Notes (Signed)
Patient was seen last month for right knee pain, pain has gotten progressively worse and swelling.

## 2015-01-14 MED ORDER — IBUPROFEN 800 MG PO TABS: 800.0000 mg | ORAL_TABLET | Freq: Three times a day (TID) | ORAL | Status: DC

## 2015-01-14 NOTE — Discharge Instructions (Signed)
Bursitis °Bursitis is a swelling and soreness (inflammation) of a fluid-filled sac (bursa) that overlies and protects a joint. It can be caused by injury, overuse of the joint, arthritis or infection. The joints most likely to be affected are the elbows, shoulders, hips and knees. °HOME CARE INSTRUCTIONS  °· Apply ice to the affected area for 15-20 minutes each hour while awake for 2 days. Put the ice in a plastic bag and place a towel between the bag of ice and your skin. °· Rest the injured joint as much as possible, but continue to put the joint through a full range of motion, 4 times per day. (The shoulder joint especially becomes rapidly "frozen" if not used.) When the pain lessens, begin normal slow movements and usual activities. °· Only take over-the-counter or prescription medicines for pain, discomfort or fever as directed by your caregiver. °· Your caregiver may recommend draining the bursa and injecting medicine into the bursa. This may help the healing process. °· Follow all instructions for follow-up with your caregiver. This includes any orthopedic referrals, physical therapy and rehabilitation. Any delay in obtaining necessary care could result in a delay or failure of the bursitis to heal and chronic pain. °SEEK IMMEDIATE MEDICAL CARE IF:  °· Your pain increases even during treatment. °· You develop an oral temperature above 102° F (38.9° C) and have heat and inflammation over the involved bursa. °MAKE SURE YOU:  °· Understand these instructions. °· Will watch your condition. °· Will get help right away if you are not doing well or get worse. °Document Released: 04/26/2000 Document Revised: 07/22/2011 Document Reviewed: 07/19/2013 °[employer]® Patient Information ©2015 [employer]. This information is not intended to replace advice given to you by your health care provider. Make sure you discuss any questions you have with your health care provider. °Cryotherapy °Cryotherapy means treatment with  cold. Ice or gel packs can be used to reduce both pain and swelling. Ice is the most helpful within the first 24 to 48 hours after an injury or flare-up from overusing a muscle or joint. Sprains, strains, spasms, burning pain, shooting pain, and aches can all be eased with ice. Ice can also be used when recovering from surgery. Ice is effective, has very few side effects, and is safe for most people to use. °PRECAUTIONS  °Ice is not a safe treatment option for people with: °· Raynaud phenomenon. This is a condition affecting small blood vessels in the extremities. Exposure to cold may cause your problems to return. °· Cold hypersensitivity. There are many forms of cold hypersensitivity, including: °¨ Cold urticaria. Red, itchy hives appear on the skin when the tissues begin to warm after being iced. °¨ Cold erythema. This is a red, itchy rash caused by exposure to cold. °¨ Cold hemoglobinuria. Red blood cells break down when the tissues begin to warm after being iced. The hemoglobin that carry oxygen are passed into the urine because they cannot combine with blood proteins fast enough. °· Numbness or altered sensitivity in the area being iced. °If you have any of the following conditions, do not use ice until you have discussed cryotherapy with your caregiver: °· Heart conditions, such as arrhythmia, angina, or chronic heart disease. °· High blood pressure. °· Healing wounds or open skin in the area being iced. °· Current infections. °· Rheumatoid arthritis. °· Poor circulation. °· Diabetes. °Ice slows the blood flow in the region it is applied. This is beneficial when trying to stop inflamed tissues from spreading   irritating chemicals to surrounding tissues. However, if you expose your skin to cold temperatures for too long or without the proper protection, you can damage your skin or nerves. Watch for signs of skin damage due to cold. °HOME CARE INSTRUCTIONS °Follow these tips to use ice and cold packs  safely. °· Place a dry or damp towel between the ice and skin. A damp towel will cool the skin more quickly, so you may need to shorten the time that the ice is used. °· For a more rapid response, add gentle compression to the ice. °· Ice for no more than 10 to 20 minutes at a time. The bonier the area you are icing, the less time it will take to get the benefits of ice. °· Check your skin after 5 minutes to make sure there are no signs of a poor response to cold or skin damage. °· Rest 20 minutes or more between uses. °· Once your skin is numb, you can end your treatment. You can test numbness by very lightly touching your skin. The touch should be so light that you do not see the skin dimple from the pressure of your fingertip. When using ice, most people will feel these normal sensations in this order: cold, burning, aching, and numbness. °· Do not use ice on someone who cannot communicate their responses to pain, such as small children or people with dementia. °HOW TO MAKE AN ICE PACK °Ice packs are the most common way to use ice therapy. Other methods include ice massage, ice baths, and cryosprays. Muscle creams that cause a cold, tingly feeling do not offer the same benefits that ice offers and should not be used as a substitute unless recommended by your caregiver. °To make an ice pack, do one of the following: °· Place crushed ice or a bag of frozen vegetables in a sealable plastic bag. Squeeze out the excess air. Place this bag inside another plastic bag. Slide the bag into a pillowcase or place a damp towel between your skin and the bag. °· Mix 3 parts water with 1 part rubbing alcohol. Freeze the mixture in a sealable plastic bag. When you remove the mixture from the freezer, it will be slushy. Squeeze out the excess air. Place this bag inside another plastic bag. Slide the bag into a pillowcase or place a damp towel between your skin and the bag. °SEEK MEDICAL CARE IF: °· You develop white spots on your  skin. This may give the skin a blotchy (mottled) appearance. °· Your skin turns blue or pale. °· Your skin becomes waxy or hard. °· Your swelling gets worse. °MAKE SURE YOU:  °· Understand these instructions. °· Will watch your condition. °· Will get help right away if you are not doing well or get worse. °Document Released: 12/24/2010 Document Revised: 09/13/2013 Document Reviewed: 12/24/2010 °ExitCare® Patient Information ©2015 [employer]. This information is not intended to replace advice given to you by your health care provider. Make sure you discuss any questions you have with your health care provider. ° °

## 2015-01-14 NOTE — ED Provider Notes (Signed)
CSN: [id number]     Arrival date & time 01/13/15  2302 History   First MD Initiated Contact with Patient 01/13/15 2311     Chief Complaint  Patient presents with  . Knee Pain     (Consider location/radiation/quality/duration/timing/severity/associated sxs/prior Treatment) Patient is a 34 y.o. male presenting with knee pain. The history is provided by the patient. No language interpreter was used.  Knee Pain Location:  Knee Knee location:  R knee Associated symptoms: no fever   Associated symptoms comment:  The patient returns to the emergency department after being evaluated for knee injury on the 22nd of August and being diagnosed with pre-patellar bursitis. He was given prednisone and admits to not taking it as directed. He has not followed up with orthopedics as referred. He returns with complaint of continued pain and swelling. No new injury.    History reviewed. No pertinent past medical history. History reviewed. No pertinent past surgical history. History reviewed. No pertinent family history. Social History  Substance Use Topics  . Smoking status: Never Smoker   . Smokeless tobacco: None  . Alcohol Use: No    Review of Systems  Constitutional: Negative for fever and chills.  Musculoskeletal:       See HPI.  Skin: Negative.  Negative for color change and wound.  Neurological: Negative.  Negative for numbness.      Allergies  Review of patient's allergies indicates no known allergies.  Home Medications   Prior to Admission medications   Medication Sig Start Date End Date Taking? Authorizing Provider  predniSONE (DELTASONE) 10 MG tablet 6, 5, 4, 3, 2 then 1 tablet by mouth daily for 6 days total. 01/02/15   Burgess Amor, PA-C  promethazine (PHENERGAN) 25 MG tablet Take 1 tablet (25 mg total) by mouth every 6 (six) hours as needed for nausea or vomiting. Patient not taking: Reported on 01/02/2015 10/24/14   Layla Maw Ward, DO  traMADol (ULTRAM) 50 MG tablet Take 1 tablet  (50 mg total) by mouth every 6 (six) hours as needed for moderate pain. 01/02/15   Burgess Amor, PA-C   BP 118/74 mmHg  Pulse 80  Temp(Src) 98.7 F (37.1 C) (Oral)  Resp 16  Ht  (1.88 m)  Wt 190 lb (86.183 kg)  BMI 24.38 kg/m2  SpO2 97% Physical Exam  Constitutional: He is oriented to person, place, and time. He appears well-developed and well-nourished.  Neck: Normal range of motion.  Pulmonary/Chest: Effort normal.  Musculoskeletal: Normal range of motion.  Right knee significant swollen anteriorly. No redness or warmth. Swollen area is soft without induration or mass. Joint stable without laxity. No calf or thigh tenderness.   Neurological: He is alert and oriented to person, place, and time.  Skin: Skin is warm and dry.  Psychiatric: He has a normal mood and affect.    ED Course  Procedures (including critical care time) Labs Review Labs Reviewed - No data to display  Imaging Review No results found. I have personally reviewed and evaluated these images and lab results as part of my medical decision-making.   EKG Interpretation None      MDM   Final diagnoses:  None    1. Persistent prepatellar bursitis  Discussed with Dr. Elesa Massed who does not advise aspiration. Will continue supportive treatment and encourage orthopedic follow up.     Elpidio Anis, PA-C 01/14/15 0026  Layla Maw Ward, DO 01/14/15 4098

## 2015-02-10 ENCOUNTER — Emergency Department (HOSPITAL_COMMUNITY): Payer: Self-pay

## 2015-02-10 ENCOUNTER — Emergency Department (HOSPITAL_COMMUNITY)
Admission: EM | Admit: 2015-02-10 | Discharge: 2015-02-10 | Disposition: A | Discharge status: Home / Self Care | Payer: Self-pay | Attending: Emergency Medicine | Admitting: Emergency Medicine

## 2015-02-10 ENCOUNTER — Encounter (HOSPITAL_COMMUNITY): Payer: Self-pay

## 2015-02-10 DIAGNOSIS — Y9389 Activity, other specified: Secondary | ICD-10-CM | POA: Insufficient documentation

## 2015-02-10 DIAGNOSIS — Z7952 Long term (current) use of systemic steroids: Secondary | ICD-10-CM | POA: Insufficient documentation

## 2015-02-10 DIAGNOSIS — R609 Edema, unspecified: Secondary | ICD-10-CM

## 2015-02-10 DIAGNOSIS — M7041 Prepatellar bursitis, right knee: Secondary | ICD-10-CM | POA: Insufficient documentation

## 2015-02-10 DIAGNOSIS — Z791 Long term (current) use of non-steroidal anti-inflammatories (NSAID): Secondary | ICD-10-CM | POA: Insufficient documentation

## 2015-02-10 LAB — CBC WITH DIFFERENTIAL/PLATELET
BASOS ABS: 0 10*3/uL (ref 0.0–0.1)
BASOS PCT: 0 %
EOS ABS: 0.1 10*3/uL (ref 0.0–0.7)
Eosinophils Relative: 1 %
HCT: 44.5 % (ref 39.0–52.0)
HEMOGLOBIN: 15.2 g/dL (ref 13.0–17.0)
Lymphocytes Relative: 35 %
Lymphs Abs: 3.1 10*3/uL (ref 0.7–4.0)
MCH: 30.5 pg (ref 26.0–34.0)
MCHC: 34.2 g/dL (ref 30.0–36.0)
MCV: 89.2 fL (ref 78.0–100.0)
MONOS PCT: 7 %
Monocytes Absolute: 0.6 10*3/uL (ref 0.1–1.0)
NEUTROS ABS: 4.9 10*3/uL (ref 1.7–7.7)
NEUTROS PCT: 57 %
Platelets: 218 10*3/uL (ref 150–400)
RBC: 4.99 MIL/uL (ref 4.22–5.81)
RDW: 12.8 % (ref 11.5–15.5)
WBC: 8.8 10*3/uL (ref 4.0–10.5)

## 2015-02-10 LAB — BASIC METABOLIC PANEL
ANION GAP: 7 (ref 5–15)
BUN: 17 mg/dL (ref 6–20)
CALCIUM: 8.8 mg/dL — AB (ref 8.9–10.3)
CHLORIDE: 106 mmol/L (ref 101–111)
CO2: 25 mmol/L (ref 22–32)
CREATININE: 1.1 mg/dL (ref 0.61–1.24)
GFR calc non Af Amer: >60 mL/min (ref >60)
Glucose, Bld: 92 mg/dL (ref 65–99)
Potassium: 3.6 mmol/L (ref 3.5–5.1)
SODIUM: 138 mmol/L (ref 135–145)

## 2015-02-10 LAB — SEDIMENTATION RATE: SED RATE: 3 mm/h (ref 0–16)

## 2015-02-10 LAB — D-DIMER, QUANTITATIVE (NOT AT ARMC)

## 2015-02-10 MED ORDER — IBUPROFEN 800 MG PO TABS: 800.0000 mg | ORAL_TABLET | Freq: Three times a day (TID) | ORAL | Status: DC

## 2015-02-10 NOTE — Discharge Instructions (Signed)
Prepatellar Bursitis with Rehab  Bursitis is a condition that is characterized by inflammation of a bursa. Kateri McBursa exists in many areas of the body. They are fluid-filled sacs that lie between a soft tissue (skin, tendon, or ligament) and a bone, and they reduce friction between the structures as well as the stress placed on the soft tissue. Prepatellar bursitis is inflammation of the bursa that lies between the skin and the kneecap (patella). This condition often causes pain over the patella. SYMPTOMS   Pain, tenderness, and/or inflammation over the patella.  Pain that worsens with movement of the knee joint.  Decreased range of motion for the knee joint.  A crackling sound (crepitation) when the bursa is moved or touched.  Occasionally, painless swelling of the bursa.  Fever (when infected). CAUSES  Bursitis is caused by damage to the bursa, which results in an inflammatory response. Common mechanisms of injury include:  Direct trauma to the front of the knee.  Repetitive and/or stressful use of the knee. RISK INCREASES WITH:  Activities in which kneeling and/or falling on one's knees is likely (volleyball or football).  Repetitive and stressful training, especially if it involves running on hills.  Improper training techniques, such as a sudden increase in the intensity, frequency, or duration of training.  Failure to warm up properly before activity.  Poor technique.  Artificial turf. PREVENTION   Avoid kneeling or falling on your knees.  Warm up and stretch properly before activity.  Allow for adequate recovery between workouts.  Maintain physical fitness:  Strength, flexibility, and endurance.  Cardiovascular fitness.  Learn and use proper technique. When possible, have a coach correct improper technique.  Wear properly fitted and padded protective equipment (kneepads). PROGNOSIS  If treated properly, then the symptoms of prepatellar bursitis usually resolve  within 2 weeks. RELATED COMPLICATIONS   Recurrent symptoms that result in a chronic problem.  Prolonged healing time, if improperly treated or reinjured.  Limited range of motion.  Infection of bursa.  Chronic inflammation or scarring of bursa. TREATMENT  Treatment initially involves the use of ice and medication to help reduce pain and inflammation. The use of strengthening and stretching exercises may help reduce pain with activity, especially those of the quadriceps and hamstring muscles. These exercises may be performed at home or with referral to a therapist. Your caregiver may recommend kneepads when you return to playing sports, in order to reduce the stress on the prepatellar bursa. If symptoms persist despite treatment, then your caregiver may drain fluid out with a needle (aspirate) the bursa. If symptoms persist for greater than 6 months despite nonsurgical (conservative) treatment, then surgery may be recommended to remove the bursa.  MEDICATION  If pain medication is necessary, then nonsteroidal anti-inflammatory medications, such as aspirin and ibuprofen, or other minor pain relievers, such as acetaminophen, are often recommended.  Do not take pain medication for 7 days before surgery.  Prescription pain relievers may be given if deemed necessary by your caregiver. Use only as directed and only as much as you need.  Corticosteroid injections may be given by your caregiver. These injections should be reserved for the most serious cases, because they may only be given a certain number of times. HEAT AND COLD  Cold treatment (icing) relieves pain and reduces inflammation. Cold treatment should be applied for 10 to 15 minutes every 2 to 3 hours for inflammation and pain and immediately after any activity that aggravates your symptoms. Use ice packs or massage the area with  a piece of ice (ice massage).  Heat treatment may be used prior to performing the stretching and  strengthening activities prescribed by your caregiver, physical therapist, or athletic trainer. Use a heat pack or soak the injury in warm water. SEEK MEDICAL CARE IF:  Treatment seems to offer no benefit, or the condition worsens.  Any medications produce adverse side effects.

## 2015-02-10 NOTE — ED Provider Notes (Signed)
CSN: [id number]     Arrival date & time 02/10/15  2041 History  By signing my name below, I, Murriel Hopper, attest that this documentation has been prepared under the direction and in the presence of Gilda Crease, MD. Electronically Signed: Murriel Hopper, ED Scribe. 02/10/2015. 9:10 PM.    Chief Complaint  Patient presents with  . Leg Swelling     The history is provided by the patient. No language interpreter was used.    HPI Comments: Edward Farrell is a 34 y.o. male who presents to the Emergency Department complaining of constant right lower leg swelling that has been present since earlier today. Pt reports he fell on his right knee about a month ago, and said that last week his knee randomly began to swell, got worse over a few days, and states he had his right knee aspirated/drained yesterday. Pt reports since then he noticed his right lower leg and ankle have been swollen, and is concerned about it, which is why he came to ED. Pt states he has been using a cold compress on his knee since it was drained. Pt denies fevers, signs of infection, or any other symptoms.     History reviewed. No pertinent past medical history. History reviewed. No pertinent past surgical history. History reviewed. No pertinent family history. Social History  Substance Use Topics  . Smoking status: Never Smoker   . Smokeless tobacco: None  . Alcohol Use: No    Review of Systems  Constitutional: Negative for fever.  Cardiovascular: Positive for leg swelling.  Musculoskeletal: Positive for arthralgias.  All other systems reviewed and are negative.     Allergies  Review of patient's allergies indicates no known allergies.  Home Medications   Prior to Admission medications   Medication Sig Start Date End Date Taking? Authorizing Provider  ibuprofen (ADVIL,MOTRIN) 800 MG tablet Take 1 tablet (800 mg total) by mouth 3 (three) times daily. 01/14/15   Elpidio Anis, PA-C  predniSONE  (DELTASONE) 10 MG tablet 6, 5, 4, 3, 2 then 1 tablet by mouth daily for 6 days total. 01/02/15   Burgess Amor, PA-C  promethazine (PHENERGAN) 25 MG tablet Take 1 tablet (25 mg total) by mouth every 6 (six) hours as needed for nausea or vomiting. Patient not taking: Reported on 01/02/2015 10/24/14   Layla Maw Ward, DO  traMADol (ULTRAM) 50 MG tablet Take 1 tablet (50 mg total) by mouth every 6 (six) hours as needed for moderate pain. 01/02/15   Burgess Amor, PA-C   BP 120/67 mmHg  Pulse 82  Temp(Src) 98.1 F (36.7 C) (Oral)  Resp 18  Ht  (1.88 m)  Wt 200 lb (90.719 kg)  BMI 25.67 kg/m2  SpO2 97% Physical Exam  Constitutional: He is oriented to person, place, and time. He appears well-developed and well-nourished. No distress.  HENT:  Head: Normocephalic and atraumatic.  Right Ear: Hearing normal.  Left Ear: Hearing normal.  Nose: Nose normal.  Mouth/Throat: Oropharynx is clear and moist and mucous membranes are normal.  Eyes: Conjunctivae and EOM are normal. Pupils are equal, round, and reactive to light.  Neck: Normal range of motion. Neck supple.  Cardiovascular: Regular rhythm, S1 normal and S2 normal.  Exam reveals no gallop and no friction rub.   No murmur heard. Pulmonary/Chest: Effort normal and breath sounds normal. No respiratory distress. He exhibits no tenderness.  Abdominal: Soft. Normal appearance and bowel sounds are normal. There is no hepatosplenomegaly. There is no tenderness.  There is no rebound, no guarding, no tenderness at McBurney's point and negative Murphy's sign. No hernia.  Musculoskeletal: Normal range of motion.  Pre-patella swelling No erythema, warmth, or redness Distal lower leg, ankle, foot has pitting edema with no erythema, warmth or redness  Neurological: He is alert and oriented to person, place, and time. He has normal strength. No cranial nerve deficit or sensory deficit. Coordination normal. GCS eye subscore is 4. GCS verbal subscore is 5. GCS motor  subscore is 6.  Skin: Skin is warm, dry and intact. No rash noted. No cyanosis.  Psychiatric: He has a normal mood and affect. His speech is normal and behavior is normal. Thought content normal.  Nursing note and vitals reviewed.   ED Course  Procedures (including critical care time)  DIAGNOSTIC STUDIES: Oxygen Saturation is 97% on room air, normal by my interpretation.    COORDINATION OF CARE: 9:07 PM Discussed treatment plan with pt at bedside and pt agreed to plan.   Labs Review Labs Reviewed - No data to display  Imaging Review No results found. I have personally reviewed and evaluated these images and lab results as part of my medical decision-making.   EKG Interpretation None      MDM   Final diagnoses:  None   pre-patella swelling and bursitis Lower extremity edema  Patient presents to the ER for evaluation of swelling of his right knee and right lower leg. Patient reports that he fell more than a month ago and injured his right knee. He has had swelling in the knee ever since. Patient reports that he was seen at [facility] several days ago and had fluid drained out of the knee. Today he noticed that his right foot and ankle is very swollen. He denies injury to that area.  Patient is afebrile. There is no erythema, warmth or discoloration of the joints. He has prepatellar swelling and possible small effusion but there is no joint effusion. No overlying skin changes, nothing to indicate septic joint. Patient reports that he has been wearing a brace over the knee continuously, lower extremity edema lipase secondary to restriction from the wrap. His d-dimer is negative, doubt DVT. Due to the amount of swelling, however, I feel it would be prudent to perform DVT study tomorrow. Patient will be counseled that this is likely inflammatory, does not require any direct intervention currently and he can follow-up with orthopedics. Will schedule for DVT study  tomorrow.  I personally performed the services described in this documentation, which was scribed in my presence. The recorded information has been reviewed and is accurate.     Gilda Crease, MD 02/10/15 2229

## 2015-02-10 NOTE — ED Notes (Signed)
Discharge instructions given, pt demonstrated teach back and verbal understanding. No concerns voiced.  

## 2015-02-10 NOTE — ED Notes (Signed)
C/o right leg swelling. Patient states he fell and hurt his knee "a few months ago" patient went to [facility] and "got his knee drained" Now patient c/o swelling and numbness to right knee.

## 2015-02-11 ENCOUNTER — Ambulatory Visit (HOSPITAL_COMMUNITY)
Admit: 2015-02-11 | Discharge: 2015-02-11 | Disposition: A | Discharge status: Home / Self Care | Payer: Self-pay | Source: Ambulatory Visit | Attending: Emergency Medicine | Admitting: Emergency Medicine

## 2015-02-11 DIAGNOSIS — M79604 Pain in right leg: Secondary | ICD-10-CM | POA: Insufficient documentation

## 2015-02-11 DIAGNOSIS — R600 Localized edema: Secondary | ICD-10-CM | POA: Insufficient documentation

## 2015-02-11 LAB — C-REACTIVE PROTEIN: CRP: 0.5 mg/dL (ref <1.0)

## 2017-06-01 IMAGING — US US EXTREM LOW VENOUS*R*
1 series · 13 of 24 positions shown · non-contrast
Comparison: None.

CLINICAL DATA: Right lower extremity pain and edema for 3 weeks

EXAM:
RIGHT LOWER EXTREMITY VENOUS DUPLEX ULTRASOUND
TECHNIQUE: Gray-scale sonography with graded compression, as well as color
Doppler and duplex ultrasound were performed to evaluate the right
lower extremity deep venous system from the level of the common
femoral vein and including the common femoral, femoral, profunda
femoral, popliteal and calf veins including the posterior tibial,
peroneal and gastrocnemius veins when visible. The superficial great
saphenous vein was also interrogated. Spectral Doppler was utilized
to evaluate flow at rest and with distal augmentation maneuvers in
the common femoral, femoral and popliteal veins.

[Series 1: us extrem low venous*right* · 0.08mm/px · 13 of 33 slices shown]
[im 1/33]
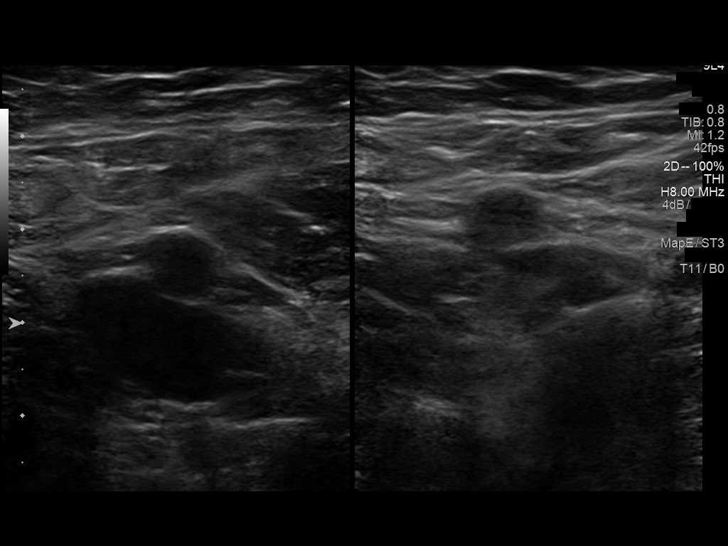
[im 3/33]
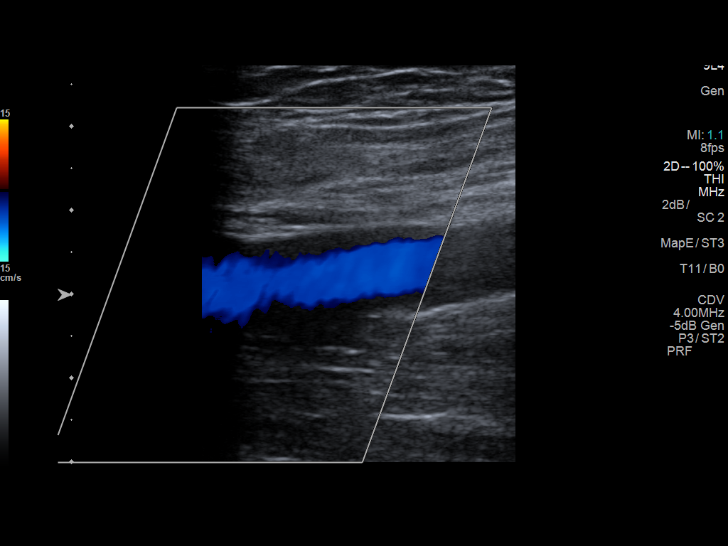
[im 6/33]
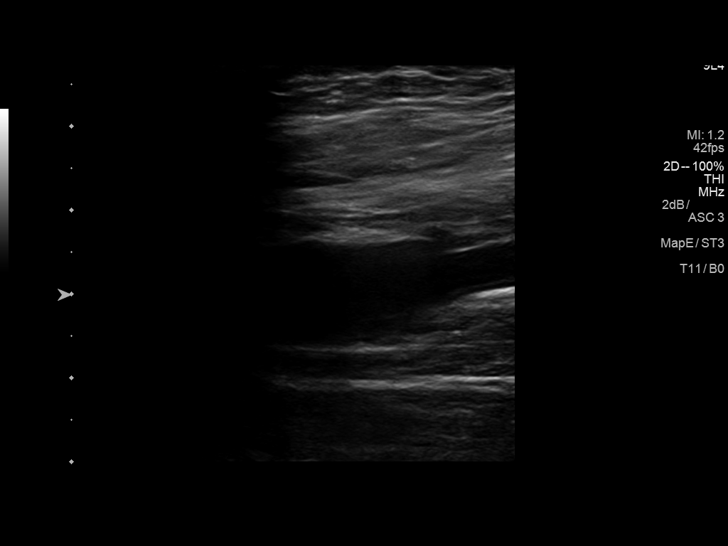
[im 9/33]
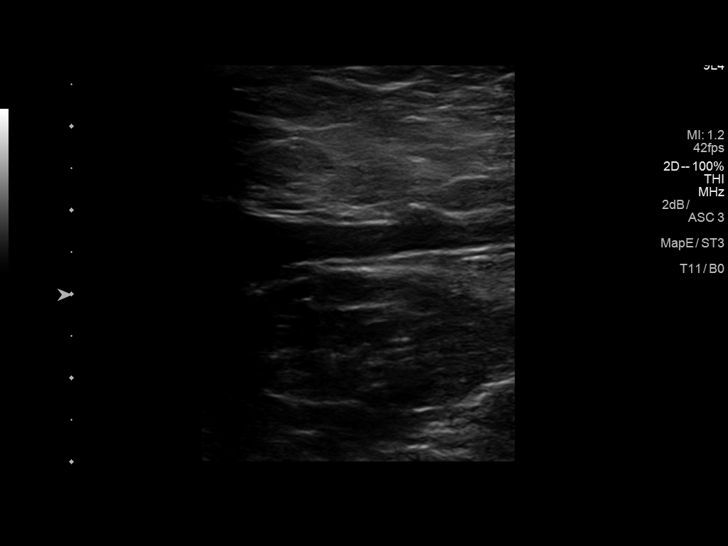
[im 12/33]
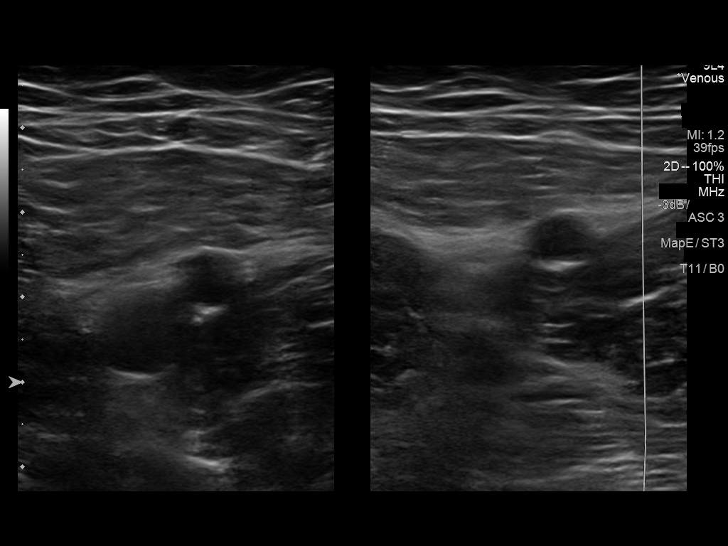
[im 14/33]
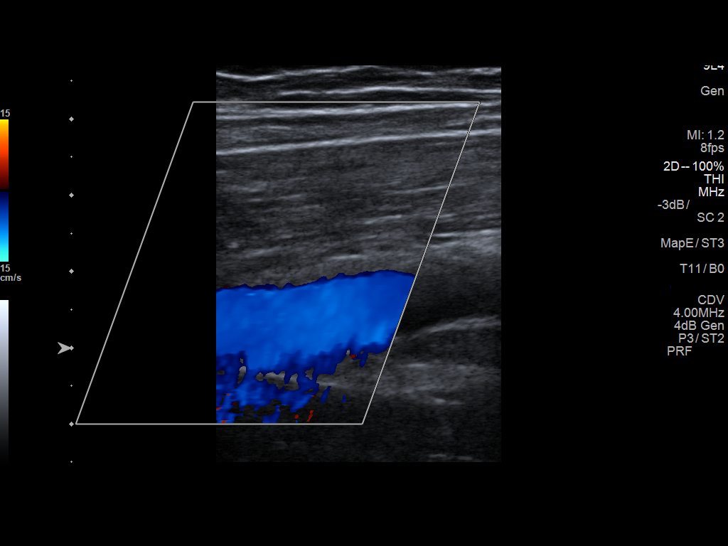
[im 17/33]
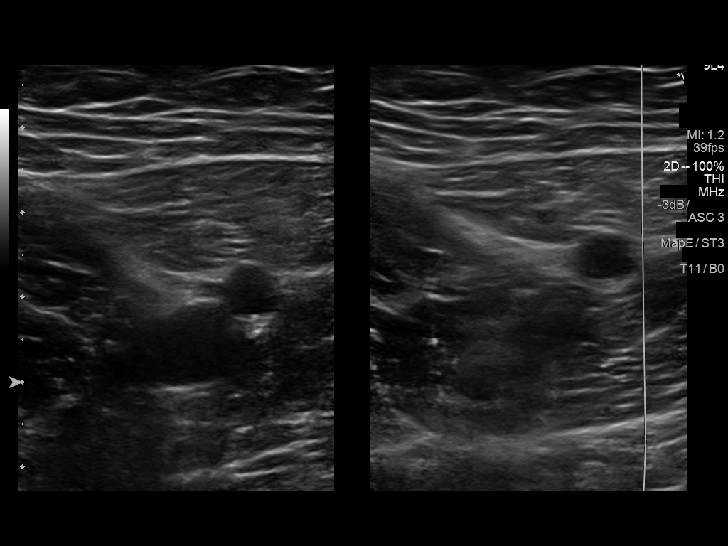
[im 19/33]
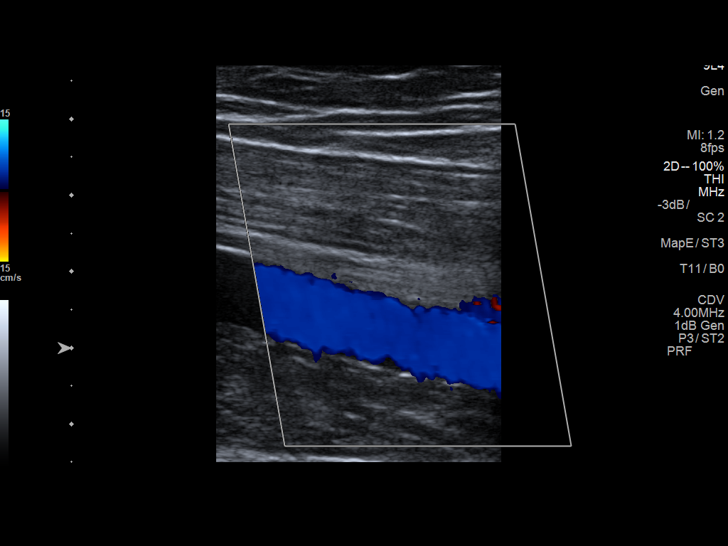
[im 21/33]
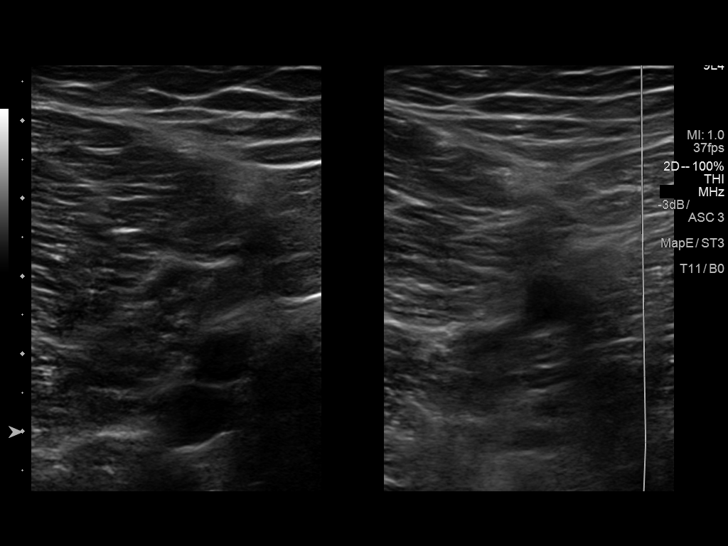
[im 24/33]
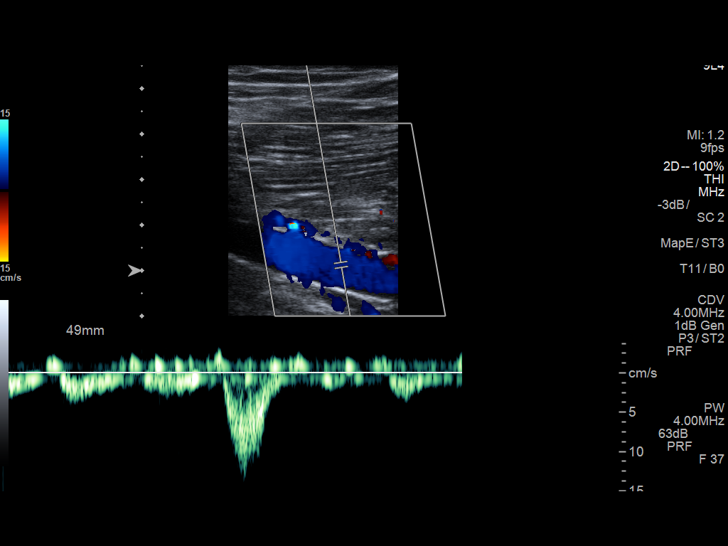
[im 27/33]
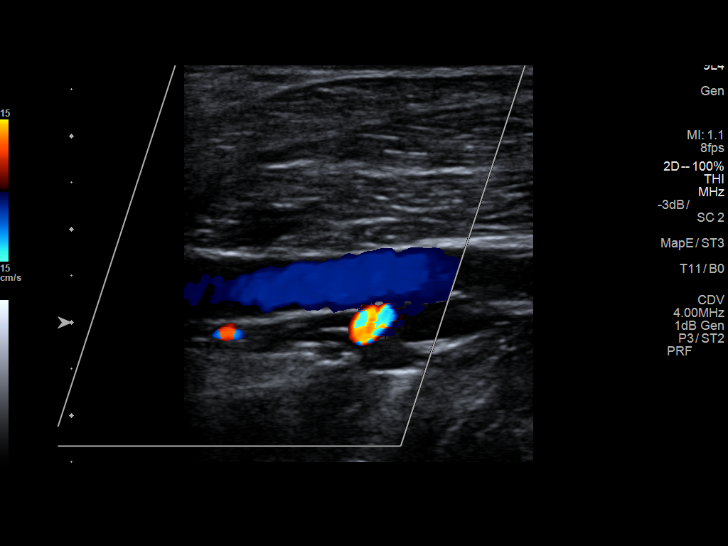
[im 30/33]
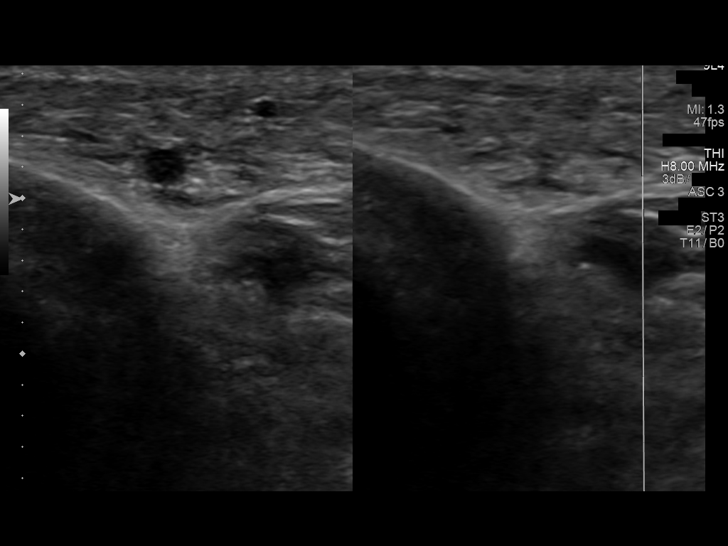
[im 33/33]
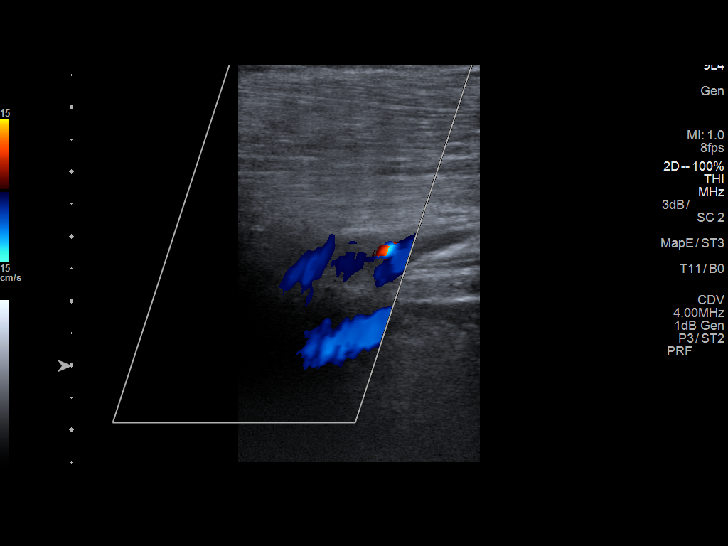

[13 of 24 positions shown; findings below may reference images not displayed]

FINDINGS: Contralateral Common Femoral Vein: Respiratory phasicity is normal
and symmetric with the symptomatic side. No evidence of thrombus.
Normal compressibility.

Common Femoral Vein: No evidence of thrombus. Normal
compressibility, respiratory phasicity and response to augmentation.

Saphenofemoral Junction: No evidence of thrombus. Normal
compressibility and flow on color Doppler imaging.

Profunda Femoral Vein: No evidence of thrombus. Normal
compressibility and flow on color Doppler imaging.

Femoral Vein: No evidence of thrombus. Normal compressibility,
respiratory phasicity and response to augmentation.

Popliteal Vein: No evidence of thrombus. Normal compressibility,
respiratory phasicity and response to augmentation.

Calf Veins: No evidence of thrombus. Normal compressibility and flow
on color Doppler imaging.

Superficial Great Saphenous Vein: No evidence of thrombus. Normal
compressibility and flow on color Doppler imaging.

Venous Reflux:  None.

Other Findings:  None.
IMPRESSION: No evidence of right lower extremity deep venous thrombosis. Left
common femoral vein also patent.

## 2018-11-30 ENCOUNTER — Other Ambulatory Visit: Payer: Self-pay

## 2018-11-30 DIAGNOSIS — Z20822 Contact with and (suspected) exposure to covid-19: Secondary | ICD-10-CM

## 2018-12-02 LAB — NOVEL CORONAVIRUS, NAA: SARS-CoV-2, NAA: NOT DETECTED

## 2019-04-04 ENCOUNTER — Emergency Department (HOSPITAL_COMMUNITY)
Admission: EM | Admit: 2019-04-04 | Discharge: 2019-04-04 | Disposition: A | Discharge status: Home / Self Care | Payer: Self-pay | Attending: Emergency Medicine | Admitting: Emergency Medicine

## 2019-04-04 ENCOUNTER — Encounter (HOSPITAL_COMMUNITY): Payer: Self-pay | Admitting: Emergency Medicine

## 2019-04-04 ENCOUNTER — Other Ambulatory Visit: Payer: Self-pay

## 2019-04-04 DIAGNOSIS — S80861A Insect bite (nonvenomous), right lower leg, initial encounter: Secondary | ICD-10-CM | POA: Insufficient documentation

## 2019-04-04 DIAGNOSIS — Y939 Activity, unspecified: Secondary | ICD-10-CM | POA: Insufficient documentation

## 2019-04-04 DIAGNOSIS — L089 Local infection of the skin and subcutaneous tissue, unspecified: Secondary | ICD-10-CM

## 2019-04-04 DIAGNOSIS — Y999 Unspecified external cause status: Secondary | ICD-10-CM | POA: Insufficient documentation

## 2019-04-04 DIAGNOSIS — W57XXXA Bitten or stung by nonvenomous insect and other nonvenomous arthropods, initial encounter: Secondary | ICD-10-CM | POA: Insufficient documentation

## 2019-04-04 DIAGNOSIS — Y9271 Barn as the place of occurrence of the external cause: Secondary | ICD-10-CM | POA: Insufficient documentation

## 2019-04-04 MED ORDER — CEPHALEXIN 500 MG PO CAPS: ORAL_CAPSULE | ORAL | 0 refills | Status: DC

## 2019-04-04 NOTE — ED Triage Notes (Addendum)
Patient c/o possible insect bite to right lower leg. Patient states he noticed a small bump to left calf two days ago that itched and has now increased insize and has scabbed over. Denies any drainage or fevers. Per patient discoloration to right shin started and tenderness.

## 2019-04-04 NOTE — ED Provider Notes (Signed)
[facility] EMERGENCY DEPARTMENT Provider Note   CSN: [id number] Arrival date & time: 04/04/19  1330     History   Chief Complaint Chief Complaint  Patient presents with  . Insect Bite    HPI Edward Farrell is a 38 y.o. male.  Who presents emergency department chief complaint of insect bite.  Patient states that he cleaned out a barn on Monday and Tuesday of this past week, 6 days ago.  4 days ago he noticed what seemed like a bug bite that was very itchy.  He scratched it.  Over the past few days he noticed an area of ulceration and tenderness arrived on the bite and yesterday developed some bruising around the bite.  He denies any streaking, fevers, chills.  He has minimal tenderness at the site.  He denies any calf pain.  He did not injure the area otherwise.     HPI  History reviewed. No pertinent past medical history.  Patient Active Problem List   Diagnosis Date Noted  . ELBOW PAIN, RIGHT 03/05/2007    History reviewed. No pertinent surgical history.      Home Medications    Prior to Admission medications   Medication Sig Start Date End Date Taking? Authorizing Provider  cephALEXin (KEFLEX) 500 MG capsule Take 500 mg by mouth 2 (two) times daily. 10 day course starting on 02/04/15 02/04/15   [provider]  ibuprofen (ADVIL,MOTRIN) 800 MG tablet Take 1 tablet (800 mg total) by mouth 3 (three) times daily. 02/10/15   Orpah Greek, MD    Family History History reviewed. No pertinent family history.  Social History Social History   Tobacco Use  . Smoking status: Never Smoker  . Smokeless tobacco: Never Used  Substance Use Topics  . Alcohol use: Yes    Alcohol/week: 1.0 standard drinks    Types: 1 Shots of liquor per week    Comment: ocassional  . Drug use: No     Allergies   Patient has no known allergies.   Review of Systems Review of Systems Ten systems reviewed and are negative for acute change, except as noted in the HPI.   Physical Exam Updated Vital Signs BP 123/72 (BP Location: Right Arm)   Pulse 92   Temp 98.6 F (37 C) (Oral)   Resp 16   SpO2 99%   Physical Exam Physical Exam  Nursing note and vitals reviewed. Constitutional: He appears well-developed and well-nourished. No distress.  HENT:  Head: Normocephalic and atraumatic.  Eyes: Conjunctivae normal are normal. No scleral icterus.  Neck: Normal range of motion. Neck supple.  Cardiovascular: Normal rate, regular rhythm and normal heart sounds.   Pulmonary/Chest: Effort normal and breath sounds normal. No respiratory distress.  Abdominal: Soft. There is no tenderness.  Musculoskeletal: He exhibits no edema.  Neurological: He is alert.  Skin: Skin is warm and dry. He is not diaphoretic. 3 x 2 cm circular area of erythema on the right medial lower extremity with central ulceration and scabbing.  Is minimally tender and indurated without central fluctuance.  There is some reticulated, wispy bruising posterior and inferior to the bite.  No lymphangitis, no calf tenderness. Psychiatric: His behavior is normal.     ED Treatments / Results  Labs (all labs ordered are listed, but only abnormal results are displayed) Labs Reviewed - No data to display  EKG None  Radiology No results found.  Procedures Procedures (including critical care time)  Medications Ordered in ED Medications -  No data to display   Initial Impression / Assessment and Plan / ED Course  I have reviewed the triage vital signs and the nursing notes.  Pertinent labs & imaging results that were available during my care of the patient were reviewed by me and considered in my medical decision making (see chart for details).        With insect bite to the right lower extremity.  Will treat with Keflex for potential soft tissue infection.  No evidence of abscess.  Afebrile and hemodynamically stable.  Discussed return precautions.  Appears appropriate for discharge at  this time.  Final Clinical Impressions(s) / ED Diagnoses   Final diagnoses:  None    ED Discharge Orders    None       Arthor Captain, PA-C 04/04/19 1351    Linwood Dibbles, MD 04/04/19 1515

## 2019-04-04 NOTE — Discharge Instructions (Addendum)
Contact a health care provider if: °You have redness, swelling, or pain in the bite area. °You have fluid or blood coming from the bite area. °The bite area feels warm to the touch. °You have pus or a bad smell coming from the bite area. °You have a fever. °Get help right away if: °You have joint pain. °You have a rash. °You feel unusually tired or sleepy. °You have neck pain. °You have a headache. °You have unusual weakness. °You develop symptoms of an anaphylactic reaction. These may include: °Flushed skin. °Hives. °Swelling of the eyes, lips, face, mouth, tongue, or throat. °Difficulty breathing, speaking, or swallowing. °Wheezing. °Dizziness or light-headedness. °Fainting. °Pain or cramping in the abdomen. °Vomiting. °Diarrhea. °

## 2024-01-15 ENCOUNTER — Emergency Department (HOSPITAL_COMMUNITY): Payer: Self-pay

## 2024-01-15 ENCOUNTER — Other Ambulatory Visit: Payer: Self-pay

## 2024-01-15 ENCOUNTER — Emergency Department (HOSPITAL_COMMUNITY)
Admission: EM | Admit: 2024-01-15 | Discharge: 2024-01-15 | Disposition: A | Discharge status: Home / Self Care | Payer: Self-pay | Attending: Emergency Medicine | Admitting: Emergency Medicine

## 2024-01-15 ENCOUNTER — Encounter (HOSPITAL_COMMUNITY): Payer: Self-pay

## 2024-01-15 DIAGNOSIS — N132 Hydronephrosis with renal and ureteral calculous obstruction: Secondary | ICD-10-CM | POA: Insufficient documentation

## 2024-01-15 DIAGNOSIS — N201 Calculus of ureter: Secondary | ICD-10-CM

## 2024-01-15 LAB — CBC WITH DIFFERENTIAL/PLATELET
Basophils Absolute: 0.1 K/uL (ref 0.0–0.1)
Basophils Relative: 1 %
Eosinophils Absolute: 0.1 K/uL (ref 0.0–0.5)
Eosinophils Relative: 1 %
HCT: 50.4 % (ref 39.0–52.0)
Hemoglobin: 17.2 g/dL — ABNORMAL HIGH (ref 13.0–17.0)
Lymphocytes Relative: 50 %
Lymphs Abs: 7 K/uL — ABNORMAL HIGH (ref 0.7–4.0)
MCH: 30.1 pg (ref 26.0–34.0)
MCHC: 34.1 g/dL (ref 30.0–36.0)
MCV: 88.3 fL (ref 80.0–100.0)
Monocytes Absolute: 0.8 K/uL (ref 0.1–1.0)
Monocytes Relative: 6 %
Neutro Abs: 5.8 K/uL (ref 1.7–7.7)
Neutrophils Relative %: 42 %
Platelets: 322 K/uL (ref 150–400)
RBC: 5.71 MIL/uL (ref 4.22–5.81)
RDW: 12.5 % (ref 11.5–15.5)
WBC: 13.9 K/uL — ABNORMAL HIGH (ref 4.0–10.5)
nRBC: 0 % (ref 0.0–0.2)

## 2024-01-15 LAB — URINALYSIS, W/ REFLEX TO CULTURE (INFECTION SUSPECTED)
Bacteria, UA: NONE SEEN
Bilirubin Urine: NEGATIVE
Glucose, UA: NEGATIVE mg/dL
Ketones, ur: 20 mg/dL — AB
Leukocytes,Ua: NEGATIVE
Nitrite: NEGATIVE
Protein, ur: NEGATIVE mg/dL
Specific Gravity, Urine: 1.017 (ref 1.005–1.030)
pH: 5 (ref 5.0–8.0)

## 2024-01-15 LAB — COMPREHENSIVE METABOLIC PANEL WITH GFR
ALT: 23 U/L (ref 0–44)
AST: 25 U/L (ref 15–41)
Albumin: 4.5 g/dL (ref 3.5–5.0)
Alkaline Phosphatase: 80 U/L (ref 38–126)
Anion gap: 13 (ref 5–15)
BUN: 9 mg/dL (ref 6–20)
CO2: 25 mmol/L (ref 22–32)
Calcium: 9.7 mg/dL (ref 8.9–10.3)
Chloride: 99 mmol/L (ref 98–111)
Creatinine, Ser: 1.15 mg/dL (ref 0.61–1.24)
GFR, Estimated: >60 mL/min (ref >60)
Glucose, Bld: 126 mg/dL — ABNORMAL HIGH (ref 70–99)
Potassium: 3.4 mmol/L — ABNORMAL LOW (ref 3.5–5.1)
Sodium: 137 mmol/L (ref 135–145)
Total Bilirubin: 1.3 mg/dL — ABNORMAL HIGH (ref 0.0–1.2)
Total Protein: 7.8 g/dL (ref 6.5–8.1)

## 2024-01-15 MED ORDER — MORPHINE SULFATE (PF) 4 MG/ML IV SOLN
4.0000 mg | Freq: Once | INTRAVENOUS | Status: AC
  Administered 2024-01-15: 4 mg via INTRAVENOUS
  Filled 2024-01-15: qty 1

## 2024-01-15 MED ORDER — KETOROLAC TROMETHAMINE 30 MG/ML IJ SOLN
15.0000 mg | Freq: Once | INTRAMUSCULAR | Status: AC
  Administered 2024-01-15: 15 mg via INTRAVENOUS
  Filled 2024-01-15: qty 1

## 2024-01-15 MED ORDER — ONDANSETRON 4 MG PO TBDP: 4.0000 mg | ORAL_TABLET | Freq: Three times a day (TID) | ORAL | 0 refills | Status: AC | PRN

## 2024-01-15 MED ORDER — SODIUM CHLORIDE 0.9 % IV BOLUS
1000.0000 mL | Freq: Once | INTRAVENOUS | Status: AC
  Administered 2024-01-15: 1000 mL via INTRAVENOUS

## 2024-01-15 MED ORDER — TAMSULOSIN HCL 0.4 MG PO CAPS: 0.4000 mg | ORAL_CAPSULE | Freq: Every day | ORAL | 0 refills | Status: AC

## 2024-01-15 MED ORDER — HYDROMORPHONE HCL 1 MG/ML IJ SOLN
1.0000 mg | Freq: Once | INTRAMUSCULAR | Status: AC
  Administered 2024-01-15: 1 mg via INTRAVENOUS
  Filled 2024-01-15: qty 1

## 2024-01-15 MED ORDER — HYDROCODONE-ACETAMINOPHEN 5-325 MG PO TABS: 1.0000 | ORAL_TABLET | Freq: Four times a day (QID) | ORAL | 0 refills | Status: AC | PRN

## 2024-01-15 MED ORDER — ONDANSETRON HCL 4 MG/2ML IJ SOLN
4.0000 mg | Freq: Once | INTRAMUSCULAR | Status: AC
  Administered 2024-01-15: 4 mg via INTRAVENOUS
  Filled 2024-01-15: qty 2

## 2024-01-15 MED ORDER — IBUPROFEN 800 MG PO TABS: 800.0000 mg | ORAL_TABLET | Freq: Three times a day (TID) | ORAL | 0 refills | Status: AC | PRN

## 2024-01-15 NOTE — ED Notes (Signed)
 Patient transported to CT

## 2024-01-15 NOTE — ED Triage Notes (Signed)
 Pov from home cc of sudden RLQ pain that woke him up around 230 am. Says pain is getting worse

## 2024-01-15 NOTE — ED Provider Notes (Signed)
 [facility] EMERGENCY DEPARTMENT AT [facility]  Provider Note  CSN: [id number] Arrival date & time: 01/15/24 9688  History Chief Complaint  Patient presents with   Abdominal Pain    Edward Farrell is a 43 y.o. male with no significant PMH was in his usual state of health until around 0230hrs when he was woken up by sudden onset of severe cramping RLQ abdominal pain, radiates into groin. No associated fever, dysuria or hematuria. No history of similar.    Home Medications Prior to Admission medications   Medication Sig Start Date End Date Taking? Authorizing Provider  HYDROcodone -acetaminophen  (NORCO/VICODIN) 5-325 MG tablet Take 1 tablet by mouth every 6 (six) hours as needed for severe pain (pain score 7-10). 01/15/24  Yes Roselyn Carlin NOVAK, MD  ibuprofen  (ADVIL ) 800 MG tablet Take 1 tablet (800 mg total) by mouth every 8 (eight) hours as needed for moderate pain (pain score 4-6). 01/15/24  Yes Roselyn Carlin NOVAK, MD  ondansetron  (ZOFRAN -ODT) 4 MG disintegrating tablet Take 1 tablet (4 mg total) by mouth every 8 (eight) hours as needed for nausea or vomiting. 01/15/24  Yes Roselyn Carlin NOVAK, MD  tamsulosin  (FLOMAX ) 0.4 MG CAPS capsule Take 1 capsule (0.4 mg total) by mouth daily. 01/15/24  Yes Roselyn Carlin NOVAK, MD     Allergies    Patient has no known allergies.   Review of Systems   Review of Systems Please see HPI for pertinent positives and negatives  Physical Exam BP (!) 149/102   Pulse 77   Temp 98.1 F (36.7 C)   Resp (!) 22   Ht 6' 2 (1.88 m)   Wt 81.6 kg   SpO2 95%   BMI 23.11 kg/m   Physical Exam Vitals and nursing note reviewed.  Constitutional:      Appearance: Normal appearance.  HENT:     Head: Normocephalic and atraumatic.     Nose: Nose normal.     Mouth/Throat:     Mouth: Mucous membranes are moist.  Eyes:     Extraocular Movements: Extraocular movements intact.     Conjunctiva/sclera: Conjunctivae normal.  Cardiovascular:     Rate  and Rhythm: Normal rate.  Pulmonary:     Effort: Pulmonary effort is normal.     Breath sounds: Normal breath sounds.  Abdominal:     General: Abdomen is flat.     Palpations: Abdomen is soft.     Tenderness: There is abdominal tenderness in the right lower quadrant. There is no guarding. Negative signs include Murphy's sign and McBurney's sign.  Musculoskeletal:        General: No swelling. Normal range of motion.     Cervical back: Neck supple.  Skin:    General: Skin is warm and dry.  Neurological:     General: No focal deficit present.     Mental Status: He is alert.  Psychiatric:        Mood and Affect: Mood normal.     ED Results / Procedures / Treatments   EKG None  Procedures Procedures  Medications Ordered in the ED Medications  morphine  (PF) 4 MG/ML injection 4 mg (4 mg Intravenous Given 01/15/24 0331)  ondansetron  (ZOFRAN ) injection 4 mg (4 mg Intravenous Given 01/15/24 0329)  sodium chloride  0.9 % bolus 1,000 mL (0 mLs Intravenous Stopped 01/15/24 0526)  ketorolac  (TORADOL ) 30 MG/ML injection 15 mg (15 mg Intravenous Given 01/15/24 0415)  HYDROmorphone  (DILAUDID ) injection 1 mg (1 mg Intravenous Given 01/15/24 0443)  ketorolac  (TORADOL ) 30 MG/ML injection 15 mg (15 mg Intravenous Given 01/15/24 0531)    Initial Impression and Plan  Patient here with sudden onset RLQ pain, no flank pain but sounds like renal colic. Less likely appendicitis. Will check labs, send for CT. Pain/nausea meds for comfort.   ED Course   Clinical Course as of 01/15/24 0619  Thu Jan 15, 2024  0350 CMP is unremarkable.  [CS]  0429 CBC with mild leukocytosis. [CS]  [phone] I personally viewed the images from radiology studies and agree with radiologist interpretation: CT shows a distal 4mm stone as the cause of his pain. He has had minimal improvement with morphine  and toradol . Will add dilaudid  and reassess.  [CS]  0550 UA with some blood, but no infection. Patient continues to have some pain, will  redose Toradol  and reassess.  [CS]  [phone] Pain is improved, patient resting in bed. Plan discharge with Rx for norco, zofran  and flomax . Urology follow up, RTED for any uncontrolled pain or other concerns.  [CS]    Clinical Course User Index [CS] Roselyn Carlin NOVAK, MD     MDM Rules/Calculators/A&P Medical Decision Making Given presenting complaint, I considered that admission might be necessary. After review of results from ED lab and/or imaging studies, admission to the hospital is not indicated at this time.    Problems Addressed: Right ureteral stone: acute illness or injury  Amount and/or Complexity of Data Reviewed Labs: ordered. Decision-making details documented in ED Course. Radiology: ordered and independent interpretation performed. Decision-making details documented in ED Course.  Risk Prescription drug management. Parenteral controlled substances. Decision regarding hospitalization.     Final Clinical Impression(s) / ED Diagnoses Final diagnoses:  Right ureteral stone    Rx / DC Orders ED Discharge Orders          Ordered    HYDROcodone -acetaminophen  (NORCO/VICODIN) 5-325 MG tablet  Every 6 hours PRN        01/15/24 0618    ondansetron  (ZOFRAN -ODT) 4 MG disintegrating tablet  Every 8 hours PRN        01/15/24 0618    tamsulosin  (FLOMAX ) 0.4 MG CAPS capsule  Daily        01/15/24 0618    ibuprofen  (ADVIL ) 800 MG tablet  Every 8 hours PRN        01/15/24 0618             Roselyn Carlin NOVAK, MD 01/15/24 [phone]
